# Patient Record
Sex: Male | Born: 1980
Health system: Southern US, Community
[De-identification: ages and names within clinical notes are randomized; demographics above are authoritative.]

## PROBLEM LIST (undated history)

## (undated) DIAGNOSIS — A749 Chlamydial infection, unspecified: Secondary | ICD-10-CM

## (undated) DIAGNOSIS — I10 Essential (primary) hypertension: Secondary | ICD-10-CM

---
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
## 2002-07-19 ENCOUNTER — Encounter: Payer: Self-pay | Admitting: Emergency Medicine

## 2002-07-19 ENCOUNTER — Emergency Department (HOSPITAL_COMMUNITY): Admission: EM | Admit: 2002-07-19 | Discharge: 2002-07-20 | Payer: Self-pay | Admitting: Emergency Medicine

## 2005-04-26 ENCOUNTER — Emergency Department (HOSPITAL_COMMUNITY): Admission: EM | Admit: 2005-04-26 | Discharge: 2005-04-27 | Payer: Self-pay | Admitting: Emergency Medicine

## 2005-06-05 ENCOUNTER — Emergency Department (HOSPITAL_COMMUNITY): Admission: EM | Admit: 2005-06-05 | Discharge: 2005-06-06 | Payer: Self-pay | Admitting: Emergency Medicine

## 2005-12-08 ENCOUNTER — Emergency Department (HOSPITAL_COMMUNITY): Admission: EM | Admit: 2005-12-08 | Discharge: 2005-12-08 | Payer: Self-pay | Admitting: Emergency Medicine

## 2005-12-16 ENCOUNTER — Encounter: Admission: RE | Admit: 2005-12-16 | Discharge: 2006-03-16 | Payer: Self-pay | Admitting: Orthopedic Surgery

## 2007-12-22 ENCOUNTER — Ambulatory Visit: Payer: Self-pay | Admitting: Family Medicine

## 2007-12-22 DIAGNOSIS — R030 Elevated blood-pressure reading, without diagnosis of hypertension: Secondary | ICD-10-CM | POA: Insufficient documentation

## 2007-12-28 ENCOUNTER — Telehealth (INDEPENDENT_AMBULATORY_CARE_PROVIDER_SITE_OTHER): Payer: Self-pay | Admitting: Family Medicine

## 2008-01-04 ENCOUNTER — Telehealth (INDEPENDENT_AMBULATORY_CARE_PROVIDER_SITE_OTHER): Payer: Self-pay | Admitting: Family Medicine

## 2008-01-20 ENCOUNTER — Telehealth (INDEPENDENT_AMBULATORY_CARE_PROVIDER_SITE_OTHER): Payer: Self-pay | Admitting: *Deleted

## 2008-01-24 ENCOUNTER — Ambulatory Visit: Payer: Self-pay | Admitting: Family Medicine

## 2008-01-24 ENCOUNTER — Encounter: Payer: Self-pay | Admitting: Family Medicine

## 2008-02-07 ENCOUNTER — Telehealth (INDEPENDENT_AMBULATORY_CARE_PROVIDER_SITE_OTHER): Payer: Self-pay | Admitting: Family Medicine

## 2008-02-07 LAB — CONVERTED CEMR LAB
LDL Cholesterol: 71 mg/dL (ref 0–99)
VLDL: 38 mg/dL (ref 0–40)

## 2008-02-14 ENCOUNTER — Telehealth (INDEPENDENT_AMBULATORY_CARE_PROVIDER_SITE_OTHER): Payer: Self-pay | Admitting: Family Medicine

## 2008-05-05 ENCOUNTER — Encounter (INDEPENDENT_AMBULATORY_CARE_PROVIDER_SITE_OTHER): Payer: Self-pay | Admitting: Family Medicine

## 2008-05-05 ENCOUNTER — Ambulatory Visit: Payer: Self-pay | Admitting: Family Medicine

## 2008-05-05 DIAGNOSIS — J029 Acute pharyngitis, unspecified: Secondary | ICD-10-CM | POA: Insufficient documentation

## 2008-07-20 ENCOUNTER — Telehealth: Payer: Self-pay | Admitting: *Deleted

## 2008-07-21 ENCOUNTER — Ambulatory Visit: Payer: Self-pay | Admitting: Family Medicine

## 2008-07-21 DIAGNOSIS — M654 Radial styloid tenosynovitis [de Quervain]: Secondary | ICD-10-CM | POA: Insufficient documentation

## 2008-10-13 ENCOUNTER — Ambulatory Visit: Payer: Self-pay | Admitting: Family Medicine

## 2008-10-13 ENCOUNTER — Encounter: Payer: Self-pay | Admitting: Family Medicine

## 2008-10-13 DIAGNOSIS — R109 Unspecified abdominal pain: Secondary | ICD-10-CM | POA: Insufficient documentation

## 2008-10-13 LAB — CONVERTED CEMR LAB
AST: 19 units/L (ref 0–37)
Albumin: 4.6 g/dL (ref 3.5–5.2)
Alkaline Phosphatase: 85 units/L (ref 39–117)
BUN: 8 mg/dL (ref 6–23)
Potassium: 3.9 meq/L (ref 3.5–5.3)
Sodium: 141 meq/L (ref 135–145)
Total Protein: 7.3 g/dL (ref 6.0–8.3)

## 2008-10-17 ENCOUNTER — Ambulatory Visit (HOSPITAL_COMMUNITY): Admission: RE | Admit: 2008-10-17 | Discharge: 2008-10-17 | Payer: Self-pay | Admitting: Family Medicine

## 2008-10-20 ENCOUNTER — Telehealth: Payer: Self-pay | Admitting: *Deleted

## 2008-11-10 ENCOUNTER — Encounter: Payer: Self-pay | Admitting: Family Medicine

## 2008-11-10 ENCOUNTER — Ambulatory Visit: Payer: Self-pay | Admitting: Family Medicine

## 2008-11-10 LAB — CONVERTED CEMR LAB: Chlamydia, Swab/Urine, PCR: NEGATIVE

## 2008-11-13 ENCOUNTER — Telehealth (INDEPENDENT_AMBULATORY_CARE_PROVIDER_SITE_OTHER): Payer: Self-pay | Admitting: *Deleted

## 2008-12-23 ENCOUNTER — Encounter (INDEPENDENT_AMBULATORY_CARE_PROVIDER_SITE_OTHER): Payer: Self-pay | Admitting: *Deleted

## 2008-12-23 DIAGNOSIS — F17200 Nicotine dependence, unspecified, uncomplicated: Secondary | ICD-10-CM | POA: Insufficient documentation

## 2009-02-01 ENCOUNTER — Ambulatory Visit: Payer: Self-pay | Admitting: Family Medicine

## 2009-02-01 ENCOUNTER — Encounter: Payer: Self-pay | Admitting: Family Medicine

## 2009-02-01 DIAGNOSIS — I10 Essential (primary) hypertension: Secondary | ICD-10-CM

## 2009-02-01 LAB — CONVERTED CEMR LAB
AST: 19 units/L (ref 0–37)
Albumin: 5.1 g/dL (ref 3.5–5.2)
Alkaline Phosphatase: 98 units/L (ref 39–117)
Bilirubin Urine: NEGATIVE
Ketones, urine, test strip: NEGATIVE
Lymphocytes Relative: 22 % (ref 12–46)
Lymphs Abs: 2.8 10*3/uL (ref 0.7–4.0)
Neutrophils Relative %: 68 % (ref 43–77)
Platelets: 251 10*3/uL (ref 150–400)
Potassium: 4.2 meq/L (ref 3.5–5.3)
Protein, U semiquant: NEGATIVE
Rapid Strep: NEGATIVE
Sodium: 141 meq/L (ref 135–145)
Total Protein: 7.8 g/dL (ref 6.0–8.3)
Urobilinogen, UA: 0.2
WBC: 12.7 10*3/uL — ABNORMAL HIGH (ref 4.0–10.5)

## 2009-02-16 ENCOUNTER — Telehealth: Payer: Self-pay | Admitting: Family Medicine

## 2009-03-27 ENCOUNTER — Ambulatory Visit: Payer: Self-pay | Admitting: Family Medicine

## 2009-08-20 ENCOUNTER — Emergency Department (HOSPITAL_COMMUNITY): Admission: EM | Admit: 2009-08-20 | Discharge: 2009-08-20 | Payer: Self-pay | Admitting: Emergency Medicine

## 2009-09-28 ENCOUNTER — Emergency Department (HOSPITAL_COMMUNITY): Admission: EM | Admit: 2009-09-28 | Discharge: 2009-09-28 | Payer: Self-pay | Admitting: Emergency Medicine

## 2010-04-16 NOTE — Assessment & Plan Note (Signed)
Summary: stomach issues,tcb   Vital Signs:  Patient profile:   30 year old male Weight:      167.5 pounds BMI:     23.45 Temp:     98.4 degrees F oral Pulse rate:   97 / minute Pulse rhythm:   regular BP sitting:   159 / 91  (left arm) Cuff size:   regular  Vitals Entered By: Loralee Pacas CMA (March 27, 2009 11:32 AM) CC: stomach issues Pain Assessment Patient in pain? no      Comments stomach problems have been going on for 2 years. pt stated that he's been back and forth to the dr's concerning this and spoke with deborah hill and she suggested that he get a referral to a GI.  he's tried everything that the dr's have perscribe to no avail.   Primary Care Provider:  Doree Albee MD  CC:  stomach issues.  History of Present Illness: 2-3 years history of abdominal pain in RUQ.  Has been seen multiple times in this office for the same problem. Pain is sharp/crampy, episodes were every several days at start, now daily.  Sometiems better with water or meals, but now nothing seems to help.  Can occur at any time of day but does not wake him up from sleep.    Has tried Prilosec, Gas-ex, OTC acid reducers without any relief.  History of negative H. Pylori 10/2008.  Ultrasound 10/2008 normal.  Denies reflux, nighttime cough, diarrhea, constipation. Has had 15-20 pound weight loss in the past year, but has cut out fast food.  Feels pressure in RUQ, but no bloating.  Nauseous several tiems weekly, rarely emesis.  Has daily bowel movements, no blood.  HYPERTENSION Meds: Taking and tolerating? NO, never started because did not feel like taking meds but feel she is ready now BP remains elevated. Home BP's: no    Current Medications (verified): 1)  Hydrochlorothiazide 25 Mg Tabs (Hydrochlorothiazide) .... Take 1/2 Tablet Daily  Allergies: No Known Drug Allergies PMH-FH-SH reviewed-no changes except otherwise noted  Family History: mother (32)- healthy father (53)- HTN, Insulin  dependant Diabetes  sister- healthy aunt and uncle are [place]ilene and Building surveyor  Colon Ca: two uncles age 1's-60's (father's side).  one of the uncle's daughter with colon cancer (age 51's-40's)  Social History: Smokes 1ppd since 1996.  Lives with [place].  Used to Engineer, manufacturing systems, now Mining engineer for living.  Education through 10th grade.  No alcohol use. No illicit drugs.  Has 2 sons who visit on the weekends. Never married.  Does not exercise.  Formerly incarcerated (unknown reason).  Review of Systems      See HPI General:  Complains of weight loss; denies fatigue, fever, loss of appetite, sleep disorder, sweats, and weakness. Eyes:  Denies blurring. ENT:  Denies sore throat. CV:  Denies chest pain or discomfort, fatigue, lightheadness, near fainting, palpitations, shortness of breath with exertion, and swelling of feet. Resp:  Denies chest discomfort and shortness of breath. GI:  Complains of abdominal pain; denies bloody stools.  Physical Exam  General:  alert and well-developed.  normal body habitus.  Multiple tattooos.  Vital signs reviewed. BP.  Rechecked 152/100 Neck:  supple and full ROM.  no lymphadenopathy. Lungs:  Normal respiratory effort, chest expands symmetrically. Lungs are clear to auscultation, no crackles or wheezes. Heart:  Normal rate and regular rhythm. S1 and S2 normal without gallop, murmur, click, rub or other extra sounds. Abdomen:  + mild tenderness to palpation  in RUQ, non distended, + bowel sounds x 4.  No rebound or guarding. Extremities:  no LE edema   Impression & Recommendations:  Problem # 1:  ABDOMINAL PAIN (ICD-789.00) Assessment Deteriorated Chronic abdominal pain with no clear etiology.  ? IBS but not classic presentation.  Given history of familial colon cancer and unclear symptoms, will refer to GI.  Orders: Gastroenterology Referral (GI) [facility]- Est  Level 4 (73710)  Problem # 2:  HYPERTENSION, BENIGN ESSENTIAL (ICD-401.1) Assessment:  Deteriorated  Patient has not started taking HCTZ.  Agrees to begin.  WIll start with 1/2 tablet daily and patient will check his BP, if astill elevated, will take one full tablet daily.  Asked pt to return to see his PCP in 1 month.  His updated medication list for this problem includes:    Hydrochlorothiazide 25 Mg Tabs (Hydrochlorothiazide) .[facility]... Take 1/2 tablet daily  Orders: [facility]- Est  Level 4 (62694)  Complete Medication List: 1)  Hydrochlorothiazide 25 Mg Tabs (Hydrochlorothiazide) .... Take 1/2 tablet daily  Patient Instructions: 1)  I sent in your blood pressure medication to [facility] on [address].  Check your blood pressure weekly: normal is less than 140/90.  If it remains elevated despite beign on medication, switch to taking a full tablet. 2)  Make follow-up appt with Dr. Alvester Morin in 1 month. 3)  Will start paperwork for GI referral.  WIll call you apopintment when available. Prescriptions: HYDROCHLOROTHIAZIDE 25 MG TABS (HYDROCHLOROTHIAZIDE) take 1/2 tablet daily  #30 x 3   Entered by:   Loralee Pacas CMA   Authorized by:   Delbert Harness MD   Signed by:   Loralee Pacas CMA on 03/27/2009   Method used:   Electronically to        [facility]       [address]       [place], [place]  [address]       Ph: [phone]       Fax: [phone]   RxID:   [id number]

## 2010-06-03 LAB — URINALYSIS, ROUTINE W REFLEX MICROSCOPIC
Glucose, UA: NEGATIVE mg/dL
Hgb urine dipstick: NEGATIVE
Ketones, ur: NEGATIVE mg/dL
Protein, ur: NEGATIVE mg/dL

## 2011-01-25 ENCOUNTER — Emergency Department (INDEPENDENT_AMBULATORY_CARE_PROVIDER_SITE_OTHER)
Admission: EM | Admit: 2011-01-25 | Discharge: 2011-01-25 | Disposition: A | Discharge status: Home / Self Care | Payer: Self-pay | Source: Home / Self Care | Attending: Emergency Medicine | Admitting: Emergency Medicine

## 2011-01-25 ENCOUNTER — Encounter: Payer: Self-pay | Admitting: *Deleted

## 2011-01-25 DIAGNOSIS — M65839 Other synovitis and tenosynovitis, unspecified forearm: Secondary | ICD-10-CM

## 2011-01-25 HISTORY — DX: Essential (primary) hypertension: I10

## 2011-01-25 MED ORDER — MELOXICAM 7.5 MG PO TABS: 7.5000 mg | ORAL_TABLET | Freq: Every day | ORAL | Status: AC

## 2011-01-25 NOTE — ED Provider Notes (Signed)
History     CSN: [id number] Arrival date & time: 01/25/2011  4:59 PM   First MD Initiated Contact with Patient 01/25/11 1545      Chief Complaint  Patient presents with  . Extremity Pain    pain right hand onset x one month no relief  same type pain couple months ago lasted approx 2 weeks - pain increases with movement   no known injury    (Consider location/radiation/quality/duration/timing/severity/associated sxs/prior treatment) HPI  Past Medical History  Diagnosis Date  . Hypertension     History reviewed. No pertinent past surgical history.  History reviewed. No pertinent family history.  History  Substance Use Topics  . Smoking status: Current Everyday Smoker  . Smokeless tobacco: Not on file  . Alcohol Use: No      Review of Systems  Allergies  Review of patient's allergies indicates no known allergies.  Home Medications   Current Outpatient Rx  Name Route Sig Dispense Refill  . HYDROCHLOROTHIAZIDE 25 MG PO TABS Oral Take 12.5 mg by mouth daily.        BP 144/93  Pulse 77  Temp(Src) 98.2 F (36.8 C) (Oral)  Resp 18  SpO2 97%  Physical Exam  ED Course  Procedures (including critical care time)  Labs Reviewed - No data to display No results found.   No diagnosis found.    MDM  R hand pain x 2 weeks- denies injury or trauma or falls - occupation painter-        Jimmie Molly, MD 01/25/11 [phone]

## 2011-01-25 NOTE — ED Notes (Signed)
Went out to get pt called pt he was not in waiting area  [place] outside and called pt he was not there either.

## 2011-08-11 ENCOUNTER — Emergency Department (INDEPENDENT_AMBULATORY_CARE_PROVIDER_SITE_OTHER)
Admission: EM | Admit: 2011-08-11 | Discharge: 2011-08-11 | Disposition: A | Discharge status: Home Health | Payer: Self-pay | Source: Home / Self Care | Attending: Emergency Medicine | Admitting: Emergency Medicine

## 2011-08-11 ENCOUNTER — Encounter (HOSPITAL_COMMUNITY): Payer: Self-pay

## 2011-08-11 DIAGNOSIS — N453 Epididymo-orchitis: Secondary | ICD-10-CM

## 2011-08-11 DIAGNOSIS — N451 Epididymitis: Secondary | ICD-10-CM

## 2011-08-11 HISTORY — DX: Chlamydial infection, unspecified: A74.9

## 2011-08-11 LAB — POCT URINALYSIS DIP (DEVICE)
Glucose, UA: NEGATIVE mg/dL
Hgb urine dipstick: NEGATIVE
Nitrite: NEGATIVE
Urobilinogen, UA: 0.2 mg/dL (ref 0.0–1.0)

## 2011-08-11 MED ORDER — CIPROFLOXACIN HCL 500 MG PO TABS: 500.0000 mg | ORAL_TABLET | Freq: Two times a day (BID) | ORAL | Status: AC

## 2011-08-11 MED ORDER — LIDOCAINE HCL (PF) 1 % IJ SOLN
INTRAMUSCULAR | Status: AC
  Filled 2011-08-11: qty 5

## 2011-08-11 MED ORDER — AZITHROMYCIN 250 MG PO TABS
ORAL_TABLET | ORAL | Status: AC
  Filled 2011-08-11: qty 4

## 2011-08-11 MED ORDER — CEFTRIAXONE SODIUM 250 MG IJ SOLR
INTRAMUSCULAR | Status: AC
  Filled 2011-08-11: qty 250

## 2011-08-11 MED ORDER — CEFTRIAXONE SODIUM 250 MG IJ SOLR
250.0000 mg | Freq: Once | INTRAMUSCULAR | Status: AC
  Administered 2011-08-11: 250 mg via INTRAMUSCULAR

## 2011-08-11 MED ORDER — AZITHROMYCIN 250 MG PO TABS
1000.0000 mg | ORAL_TABLET | Freq: Once | ORAL | Status: AC
  Administered 2011-08-11: 1000 mg via ORAL

## 2011-08-11 NOTE — ED Notes (Signed)
Pt c/o testicular pain onset 3-4 weeks ago.  Pt denies urinary SX, denies penile discharge.  Pt denies injury.

## 2011-08-11 NOTE — Discharge Instructions (Signed)
To give [place] a working phone number so that we can contact you if needed. If your labs come back positive, you will need to have your partner treated as well. Refrain from intercourse until you feel better. I would suggest following up the primary care physician in about 5 days, to get your results, and be reevaluated to see if you're doing better, and if necessary, to get appropriate imaging. Return to the ER for fever above 100.4, if you pain changes or gets worse, if you start vomiting, or any other concerns.

## 2011-08-11 NOTE — ED Provider Notes (Signed)
History     CSN: [id number]  Arrival date & time 08/11/11  1512   First MD Initiated Contact with Patient 08/11/11 1655      Chief Complaint  Patient presents with  . Testicle Pain    (Consider location/radiation/quality/duration/timing/severity/associated sxs/prior treatment) HPI Comments: Patient reports achy, constant, nonradiating, testicular pain at the base of his scrotum starting about a month ago. It gets worse when he stands, and slightly better when he cups his testicles for support. It is not affected with Valsalva, or heavy lifting. He has not noticed any bulging in his groin, testicular swelling, color change. States his testicles feel normal to him. He has been doing regular testicular self exams.  No nausea, vomiting, hematuria, urinary urgency, frequency, penile discharge, penile rash. No abdominal, flank, or back pain. He denies any recent or remote history of trauma to his testicles. No unintentional weight loss. He is sexually active with 2 male partners, both of which are asymptomatic. He states he's not had intercourse since he started having these symptoms. He intermittently uses condoms with them. He has a history of Chlamydia. No history of gonorrhea, herpes, HIV, syphilis, prostatitis. No history of UTIs, kidney stones.  ROS as noted in HPI. All other ROS negative.   Patient is a 31 y.o. male presenting with testicular pain.  Testicle Pain    Past Medical History  Diagnosis Date  . Hypertension   . Chlamydia     History reviewed. No pertinent past surgical history.  History reviewed. No pertinent family history.  History  Substance Use Topics  . Smoking status: Current Everyday Smoker  . Smokeless tobacco: Not on file  . Alcohol Use: Yes      Review of Systems  Genitourinary: Positive for testicular pain.    Allergies  Review of patient's allergies indicates no known allergies.  Home Medications   Current Outpatient Rx  Name Route Sig  Dispense Refill  . CIPROFLOXACIN HCL 500 MG PO TABS Oral Take 1 tablet (500 mg total) by mouth 2 (two) times daily. X 10 days 20 tablet 0  . HYDROCHLOROTHIAZIDE 25 MG PO TABS Oral Take 12.5 mg by mouth daily.      . MELOXICAM 7.5 MG PO TABS Oral Take 1 tablet (7.5 mg total) by mouth daily. 14 tablet 0    BP 121/71  Pulse 82  Temp(Src) 98.2 F (36.8 C) (Oral)  Resp 16  SpO2 96%  Physical Exam  Nursing note and vitals reviewed. Constitutional: He is oriented to person, place, and time. He appears well-developed and well-nourished.  HENT:  Head: Normocephalic and atraumatic.  Eyes: Conjunctivae and EOM are normal.  Neck: Normal range of motion.  Cardiovascular: Normal rate.   Pulmonary/Chest: Effort normal. No respiratory distress.  Abdominal: Normal appearance and bowel sounds are normal. He exhibits no distension. There is no tenderness. There is no rebound, no guarding and no CVA tenderness. Hernia confirmed negative in the right inguinal area and confirmed negative in the left inguinal area.  Genitourinary: Rectum normal, prostate normal, testes normal and penis normal. Prostate is not tender. Cremasteric reflex is present. Right testis shows no mass, no swelling and no tenderness. Cremasteric reflex is not absent on the right side. Left testis shows no mass, no swelling and no tenderness. Cremasteric reflex is not absent on the left side. Circumcised.       No testicular tenderness. Mild tenderness at the uppermost part of the scrotum. No genital rash, penile discharge. Patient declined chaperone  Musculoskeletal: Normal range of motion.  Lymphadenopathy:       Right: No inguinal adenopathy present.  Neurological: He is alert and oriented to person, place, and time.  Skin: Skin is warm and dry.  Psychiatric: He has a normal mood and affect. His behavior is normal.    ED Course  Procedures (including critical care time)  Labs Reviewed  POCT URINALYSIS DIP (DEVICE) - Abnormal;  Notable for the following:    Ketones, ur TRACE (*)    All other components within normal limits  GC/CHLAMYDIA PROBE AMP, GENITAL  URINE CULTURE   No results found.   1. Epididymitis       MDM  H&P most consistent with epididymitis. Doubt torsion, as this is been constant, and going on for a month. Will treat empirically for STI causing his symptoms. Giving Rocephin term 50 mg and azithromycin 1 g by mouth here sending GC probe off. Urine is normal today. Sending this off for culture as well. Will send him home with Cipro 500 mg twice a day x10 days. Advised patient to refrain from sexual intercourse until he knows results. Advised him to followup with his primary care physician in about 5 days for all the results, reevaluation, imaging if needed, and referral to urology if needed. Advised ice, NSAIDs, testicular support. Discussed labs, MDM with patient. Patient agrees with plan.     Luiz Blare, MD 08/11/11 [phone]

## 2011-08-12 LAB — GC/CHLAMYDIA PROBE AMP, GENITAL
Chlamydia, DNA Probe: NEGATIVE
GC Probe Amp, Genital: NEGATIVE

## 2011-08-12 LAB — URINE CULTURE

## 2011-08-18 ENCOUNTER — Telehealth (HOSPITAL_COMMUNITY): Payer: Self-pay | Admitting: *Deleted

## 2011-08-18 NOTE — ED Notes (Signed)
Pt. called for his lab results. I told him I would call back, because I was with a pt. I called back. Pt. verified x 2 and given results (GC/Chlamydia neg.). Vassie Moselle 08/18/2011

## 2016-09-11 ENCOUNTER — Emergency Department (HOSPITAL_COMMUNITY): Payer: Self-pay

## 2016-09-11 ENCOUNTER — Encounter (HOSPITAL_COMMUNITY): Payer: Self-pay | Admitting: Emergency Medicine

## 2016-09-11 ENCOUNTER — Emergency Department (HOSPITAL_COMMUNITY)
Admission: EM | Admit: 2016-09-11 | Discharge: 2016-09-11 | Disposition: A | Discharge status: Home / Self Care | Payer: Self-pay | Attending: Emergency Medicine | Admitting: Emergency Medicine

## 2016-09-11 DIAGNOSIS — I10 Essential (primary) hypertension: Secondary | ICD-10-CM | POA: Insufficient documentation

## 2016-09-11 DIAGNOSIS — N50811 Right testicular pain: Secondary | ICD-10-CM | POA: Insufficient documentation

## 2016-09-11 DIAGNOSIS — N50819 Testicular pain, unspecified: Secondary | ICD-10-CM

## 2016-09-11 DIAGNOSIS — F17200 Nicotine dependence, unspecified, uncomplicated: Secondary | ICD-10-CM | POA: Insufficient documentation

## 2016-09-11 DIAGNOSIS — R1031 Right lower quadrant pain: Secondary | ICD-10-CM | POA: Insufficient documentation

## 2016-09-11 LAB — BASIC METABOLIC PANEL
Anion gap: 9 (ref 5–15)
BUN: 9 mg/dL (ref 6–20)
CALCIUM: 9.4 mg/dL (ref 8.9–10.3)
CO2: 26 mmol/L (ref 22–32)
Chloride: 103 mmol/L (ref 101–111)
Creatinine, Ser: 1.02 mg/dL (ref 0.61–1.24)
GFR calc Af Amer: >60 mL/min (ref >60)
GFR calc non Af Amer: >60 mL/min (ref >60)
GLUCOSE: 141 mg/dL — AB (ref 65–99)
Potassium: 4 mmol/L (ref 3.5–5.1)
SODIUM: 138 mmol/L (ref 135–145)

## 2016-09-11 LAB — URINALYSIS, ROUTINE W REFLEX MICROSCOPIC
BACTERIA UA: NONE SEEN
BILIRUBIN URINE: NEGATIVE
Glucose, UA: NEGATIVE mg/dL
HGB URINE DIPSTICK: NEGATIVE
KETONES UR: NEGATIVE mg/dL
LEUKOCYTES UA: NEGATIVE
NITRITE: NEGATIVE
PH: 5 (ref 5.0–8.0)
Protein, ur: NEGATIVE mg/dL
Specific Gravity, Urine: 1.025 (ref 1.005–1.030)

## 2016-09-11 LAB — CBC
HCT: 46.5 % (ref 39.0–52.0)
Hemoglobin: 16.5 g/dL (ref 13.0–17.0)
MCH: 30 pg (ref 26.0–34.0)
MCHC: 35.5 g/dL (ref 30.0–36.0)
MCV: 84.5 fL (ref 78.0–100.0)
PLATELETS: 272 10*3/uL (ref 150–400)
RBC: 5.5 MIL/uL (ref 4.22–5.81)
RDW: 13 % (ref 11.5–15.5)
WBC: 11.9 10*3/uL — AB (ref 4.0–10.5)

## 2016-09-11 MED ORDER — ONDANSETRON HCL 4 MG/2ML IJ SOLN
4.0000 mg | Freq: Once | INTRAMUSCULAR | Status: AC
  Administered 2016-09-11: 4 mg via INTRAVENOUS
  Filled 2016-09-11: qty 2

## 2016-09-11 MED ORDER — HYDROCODONE-ACETAMINOPHEN 5-325 MG PO TABS: 1.0000 | ORAL_TABLET | ORAL | 0 refills | Status: DC | PRN

## 2016-09-11 MED ORDER — HYDROMORPHONE HCL 1 MG/ML IJ SOLN
1.0000 mg | Freq: Once | INTRAMUSCULAR | Status: AC
  Administered 2016-09-11: 1 mg via INTRAVENOUS
  Filled 2016-09-11: qty 1

## 2016-09-11 MED ORDER — ONDANSETRON 8 MG PO TBDP: 8.0000 mg | ORAL_TABLET | Freq: Three times a day (TID) | ORAL | 0 refills | Status: DC | PRN

## 2016-09-11 MED ORDER — KETOROLAC TROMETHAMINE 30 MG/ML IJ SOLN
30.0000 mg | Freq: Once | INTRAMUSCULAR | Status: AC
  Administered 2016-09-11: 30 mg via INTRAVENOUS
  Filled 2016-09-11: qty 1

## 2016-09-11 NOTE — ED Provider Notes (Signed)
[facility]-EMERGENCY DEPT Provider Note   CSN: [id number] Arrival date & time: 09/11/16  0012  By signing my name below, I, Rosana Fretana Waskiewicz, attest that this documentation has been prepared under the direction and in the presence of Azalia Bilisampos, Gregorio Worley, MD. Electronically Signed: Rosana Fretana Waskiewicz, ED Scribe. 09/11/16. 2:19 AM.  History   Chief Complaint Chief Complaint  Patient presents with  . Testicle Pain   The history is provided by the patient. No language interpreter was used.   HPI Comments: Matthew Stokes is a 36 y.o. male who presents to the Emergency Department via EMS complaining of sudden onset, right-sided abdominal pain onset 3 hours ago. Pt states he was getting showering when he step out and felt pain shooting down to his right testicle. Pt reports associated vomiting and nausea. No treatments tried prior to arrival. Pt denies fever or any other complaints at this time.  Past Medical History:  Diagnosis Date  . Chlamydia   . Hypertension     Patient Active Problem List   Diagnosis Date Noted  . HYPERTENSION, BENIGN ESSENTIAL 02/01/2009  . TOBACCO USER 12/23/2008  . ABDOMINAL PAIN 10/13/2008  . DE QUERVAIN'S TENOSYNOVITIS 07/21/2008  . SORE THROAT 05/05/2008  . PREHYPERTENSION 12/22/2007    History reviewed. No pertinent surgical history.     Home Medications    Prior to Admission medications   Medication Sig Start Date End Date Taking? Authorizing Provider  ibuprofen (ADVIL,MOTRIN) 200 MG tablet Take 800 mg by mouth every 6 (six) hours as needed for moderate pain.   Yes [provider]    Family History History reviewed. No pertinent family history.  Social History Social History  Substance Use Topics  . Smoking status: Current Every Day Smoker  . Smokeless tobacco: Never Used  . Alcohol use Yes     Allergies   Patient has no known allergies.   Review of Systems Review of Systems All other systems reviewed and are negative for acute  change except as noted in the HPI.  Physical Exam Updated Vital Signs BP (!) 157/94   Pulse 89   Temp 97.9 F (36.6 C) (Oral)   Resp 18   SpO2 90%   Physical Exam  Constitutional: He is oriented to person, place, and time. He appears well-developed and well-nourished.  HENT:  Head: Normocephalic and atraumatic.  Eyes: EOM are normal.  Neck: Normal range of motion.  Cardiovascular: Normal rate, regular rhythm, normal heart sounds and intact distal pulses.   Pulmonary/Chest: Effort normal and breath sounds normal. No respiratory distress.  Abdominal: Soft. He exhibits no distension. There is no tenderness.  Genitourinary:  Genitourinary Comments: No testicular tenderness. No palpable hernia on the right.   Musculoskeletal: Normal range of motion.  Neurological: He is alert and oriented to person, place, and time.  Skin: Skin is warm and dry.  Psychiatric: He has a normal mood and affect. Judgment normal.  Nursing note and vitals reviewed.    ED Treatments / Results  DIAGNOSTIC STUDIES: Oxygen Saturation is 96% on RA, normal by my interpretation.   COORDINATION OF CARE: 2:14 AM-Discussed next steps with pt including pain management and a CT scan. Pt verbalized understanding and is agreeable with the plan.   Labs (all labs ordered are listed, but only abnormal results are displayed) Labs Reviewed  URINALYSIS, ROUTINE W REFLEX MICROSCOPIC - Abnormal; Notable for the following:       Result Value   Squamous Epithelial / LPF 0-5 (*)    All  other components within normal limits  CBC - Abnormal; Notable for the following:    WBC 11.9 (*)    All other components within normal limits  BASIC METABOLIC PANEL - Abnormal; Notable for the following:    Glucose, Bld 141 (*)    All other components within normal limits    EKG  EKG Interpretation None       Radiology US Scrotum  Result Date: 09/11/2016 CLINICAL DATA:  Pain in the abdominal right lower quadrant radiating  to the right hemiscrotum. EXAM: SCROTAL ULTRASOUND DOPPLER ULTRASOUND OF THE TESTICLES TECHNIQUE: Complete ultrasound examination of the testicles, epididymis, and other scrotal structures was performed. Color and spectral Doppler ultrasound were also utilized to evaluate blood flow to the testicles. COMPARISON:  None. FINDINGS: Right testicle Measurements: 4.9 x 3.2 x 3.4 cm. No mass or microlithiasis visualized. Left testicle Measurements: 5.1 x 2.7 x 3.5 Cm. No mass or microlithiasis visualized. Right epididymis:  3 mm epididymal head cyst Left epididymis:  Normal in size and appearance. Hydrocele:  Small bilateral hydroceles Varicocele:  None visualized. Pulsed Doppler interrogation of both testes demonstrates normal low resistance arterial and venous waveforms bilaterally. IMPRESSION: No testicular mass or torsion.  Small bilateral hydroceles. Electronically Signed   By: Ellery Plunk M.D.   On: 09/11/2016 05:23   [place] Art/ven Flow Abd Pelv Doppler  Result Date: 09/11/2016 CLINICAL DATA:  Pain in the abdominal right lower quadrant radiating to the right hemiscrotum. EXAM: SCROTAL ULTRASOUND DOPPLER ULTRASOUND OF THE TESTICLES TECHNIQUE: Complete ultrasound examination of the testicles, epididymis, and other scrotal structures was performed. Color and spectral Doppler ultrasound were also utilized to evaluate blood flow to the testicles. COMPARISON:  None. FINDINGS: Right testicle Measurements: 4.9 x 3.2 x 3.4 cm. No mass or microlithiasis visualized. Left testicle Measurements: 5.1 x 2.7 x 3.5 Cm. No mass or microlithiasis visualized. Right epididymis:  3 mm epididymal head cyst Left epididymis:  Normal in size and appearance. Hydrocele:  Small bilateral hydroceles Varicocele:  None visualized. Pulsed Doppler interrogation of both testes demonstrates normal low resistance arterial and venous waveforms bilaterally. IMPRESSION: No testicular mass or torsion.  Small bilateral hydroceles. Electronically  Signed   By: Ellery Plunk M.D.   On: 09/11/2016 05:23   Ct Renal Stone Study  Result Date: 09/11/2016 CLINICAL DATA:  Abdominal pain radiating to the groin. EXAM: CT ABDOMEN AND PELVIS WITHOUT CONTRAST TECHNIQUE: Multidetector CT imaging of the abdomen and pelvis was performed following the standard protocol without IV contrast. COMPARISON:  None. FINDINGS: Lower chest: No acute abnormality. Hepatobiliary: Diffuse fatty infiltration of the liver without focal lesion. Gallbladder and bile ducts are unremarkable. Pancreas: Unremarkable. No pancreatic ductal dilatation or surrounding inflammatory changes. Spleen: Normal in size without focal abnormality. Adrenals/Urinary Tract: Both adrenals are normal. No suspicious renal parenchymal lesions. No hydronephrosis. Multiple 2-3 mm calculi in both collecting systems. No ureteral calculi. Unremarkable urinary bladder Stomach/Bowel: Small hiatal hernia. Stomach is within normal limits. Appendix is normal. No evidence of bowel wall thickening, distention, or inflammatory changes. Vascular/Lymphatic: The abdominal aorta is normal in caliber with mild atherosclerotic calcification. No adenopathy in the abdomen or pelvis. Reproductive: Unremarkable Other: No focal inflammation. No ascites. Small fat containing umbilical hernia. Musculoskeletal: No significant skeletal lesion. IMPRESSION: 1. Bilateral nephrolithiasis.  No ureteral calculi. 2. Hepatic steatosis. 3. Small fat containing umbilical hernia. 4. Aortic atherosclerosis. Electronically Signed   By: Ellery Plunk M.D.   On: 09/11/2016 04:00    Procedures Procedures (including critical care time)  Medications Ordered in ED Medications  HYDROmorphone (DILAUDID) injection 1 mg (1 mg Intravenous Given 09/11/16 0249)  ondansetron (ZOFRAN) injection 4 mg (4 mg Intravenous Given 09/11/16 0249)  ketorolac (TORADOL) 30 MG/ML injection 30 mg (30 mg Intravenous Given 09/11/16 0248)     Initial Impression /  Assessment and Plan / ED Course  I have reviewed the triage vital signs and the nursing notes.  Pertinent labs & imaging results that were available during my care of the patient were reviewed by me and considered in my medical decision making (see chart for details).     6:08 AM Feels much better this time.  CT scan without acute ureteral stone.  May represent recently passed ureteral stone.  Ultrasound testicle shows good flow and no signs of epididymitis.  No hernia on examination.  No testicular tenderness.  Feels much better.  Primary care and urology follow-up.  Understands to return to the ER for new or worsening symptoms  Final Clinical Impressions(s) / ED Diagnoses   Final diagnoses:  Acute right lower quadrant pain  Testicular pain    New Prescriptions New Prescriptions   HYDROCODONE-ACETAMINOPHEN (NORCO/VICODIN) 5-325 MG TABLET    Take 1 tablet by mouth every 4 (four) hours as needed for moderate pain.   ONDANSETRON (ZOFRAN ODT) 8 MG DISINTEGRATING TABLET    Take 1 tablet (8 mg total) by mouth every 8 (eight) hours as needed for nausea or vomiting.   I personally performed the services described in this documentation, which was scribed in my presence. The recorded information has been reviewed and is accurate.       Azalia Bilis, MD 09/11/16 [phone]

## 2016-09-11 NOTE — ED Triage Notes (Signed)
Pt while showering lifted right leg causing pain in right testes radiating up into abdomin

## 2016-09-11 NOTE — ED Notes (Signed)
Pt is c/o of RLQ  9/10 that began yesterday pt described  as crushing. Pt has associated N/V and denies diarrhea, pain or burning with urination and any blood noted in his stool.

## 2018-02-08 IMAGING — CT CT RENAL STONE PROTOCOL
2 of 3 series · 16 of 46 positions shown, 18 images · non-contrast
Comparison: None.

CLINICAL DATA: Abdominal pain radiating to the groin.

EXAM:
CT ABDOMEN AND PELVIS WITHOUT CONTRAST
TECHNIQUE: Multidetector CT imaging of the abdomen and pelvis was performed
following the standard protocol without IV contrast.

[Series 3: lung · axial · 0.70mm/px · z∈[+1508,+1596]mm · 13 of 52 slices shown, 15 images]
[im 4/52  soft-tissue]
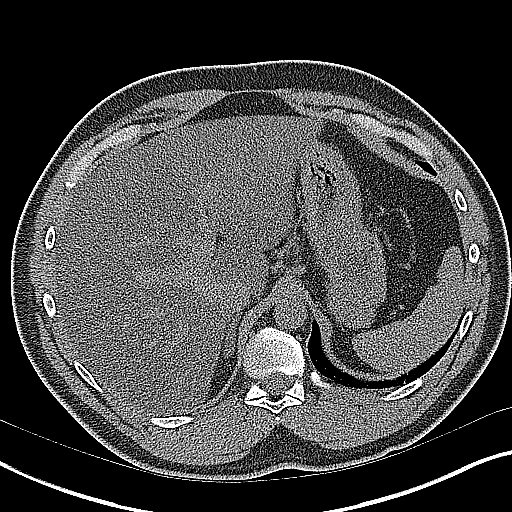
[im 4/52  bone]
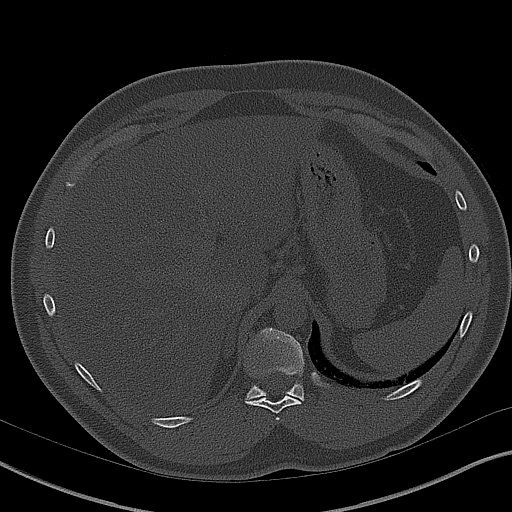
[im 7/52  soft-tissue]
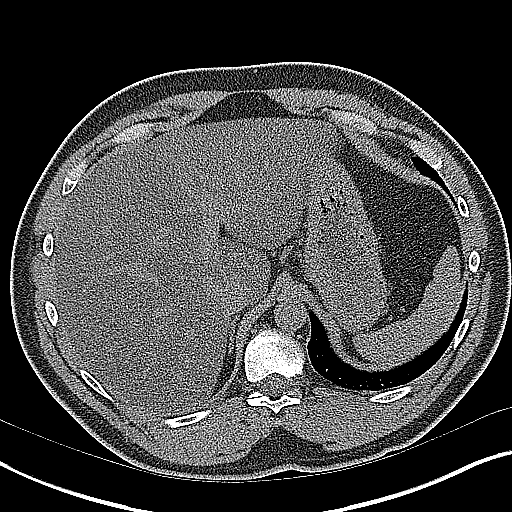
[im 10/52  soft-tissue]
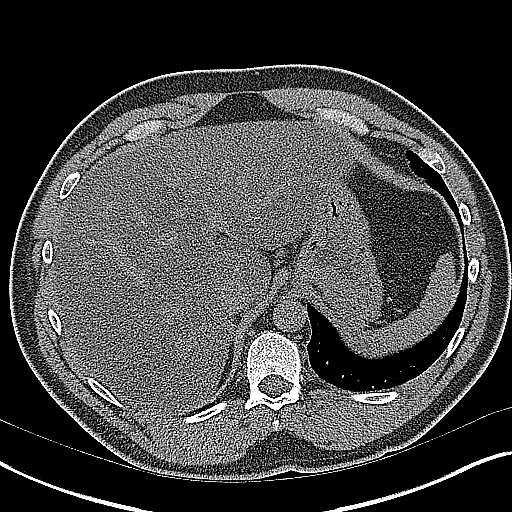
[im 15/52  soft-tissue]
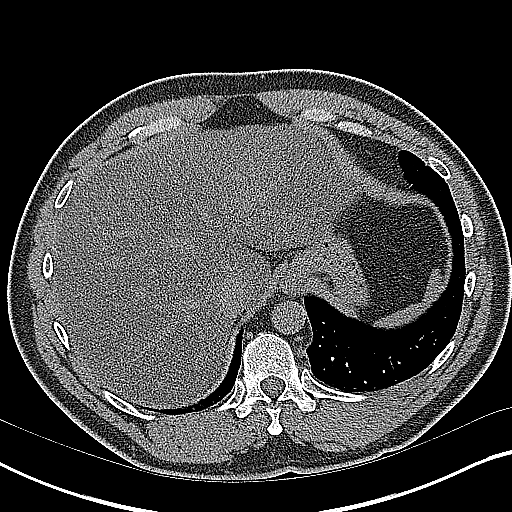
[im 19/52  soft-tissue]
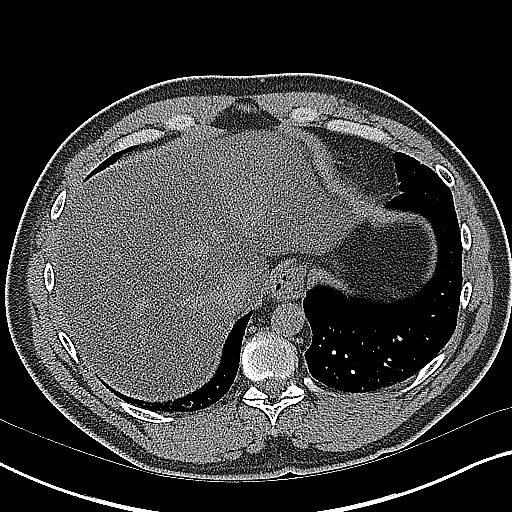
[im 22/52  soft-tissue]
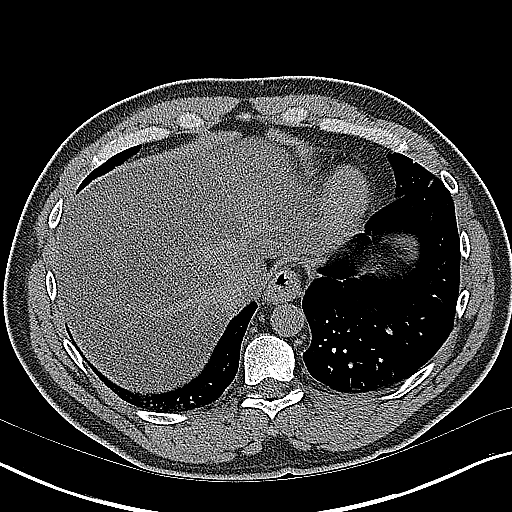
[im 27/52  soft-tissue]
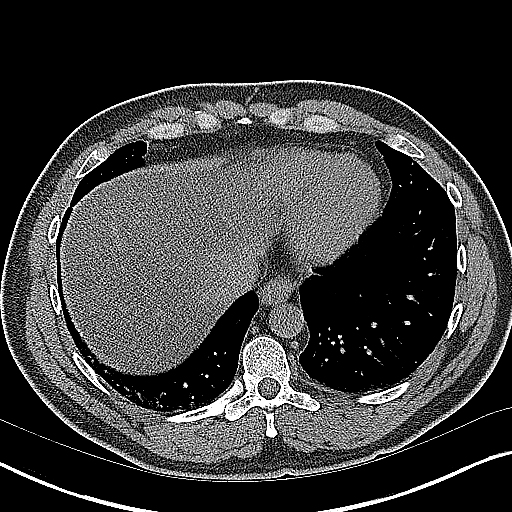
[im 30/52  soft-tissue]
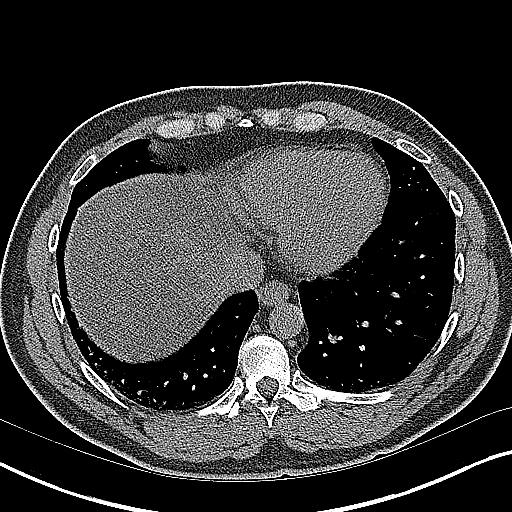
[im 33/52  soft-tissue]
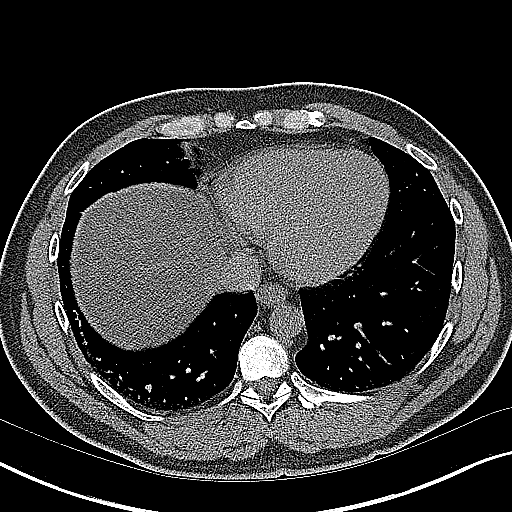
[im 33/52  bone]
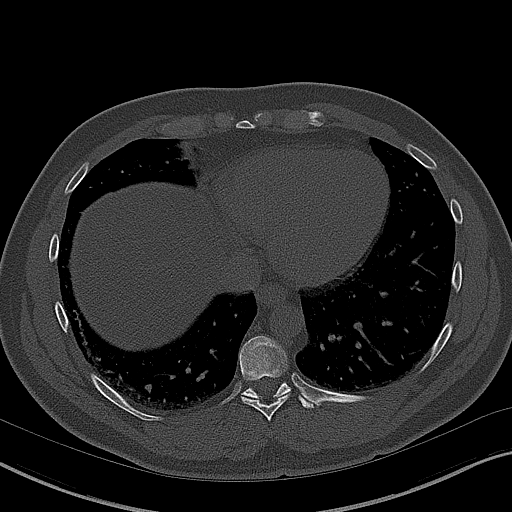
[im 37/52  soft-tissue]
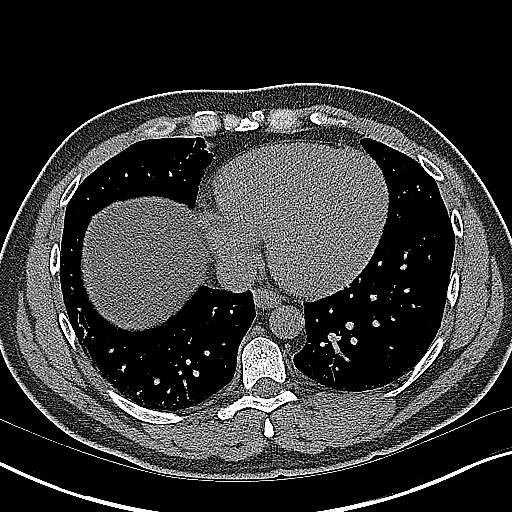
[im 42/52  soft-tissue]
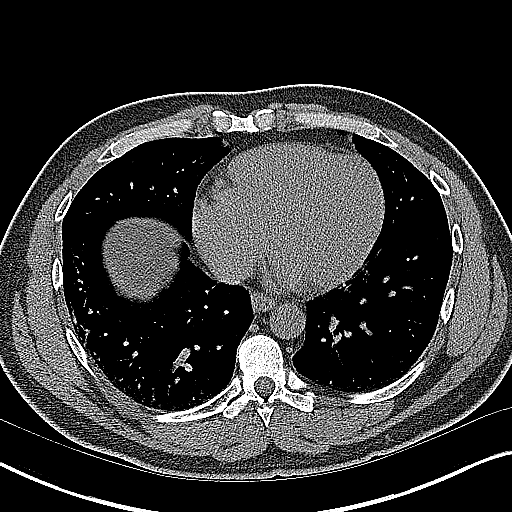
[im 45/52  soft-tissue]
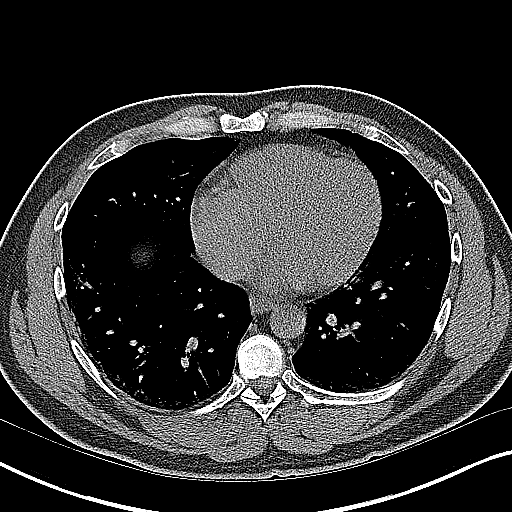
[im 48/52  soft-tissue]
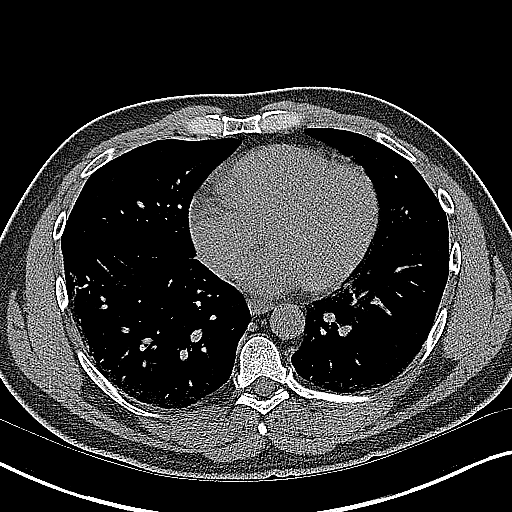

[Series 4: coronal · coronal · 0.66mm/px · 3 of 134 slices shown]
[im 45/134  soft-tissue]
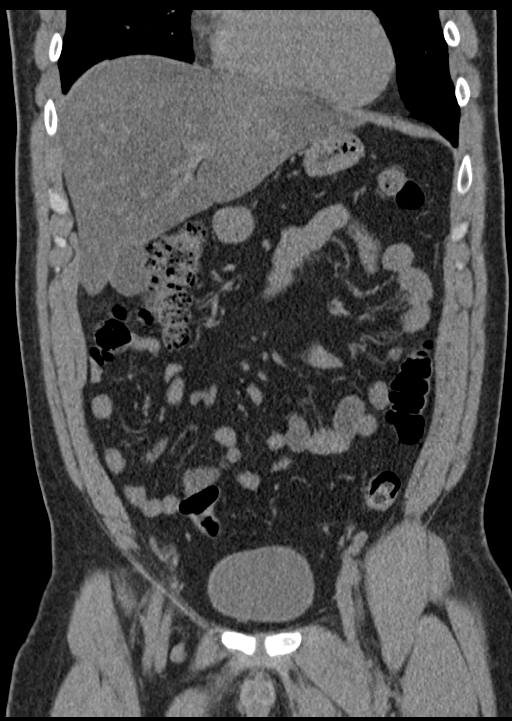
[im 60/134  soft-tissue]
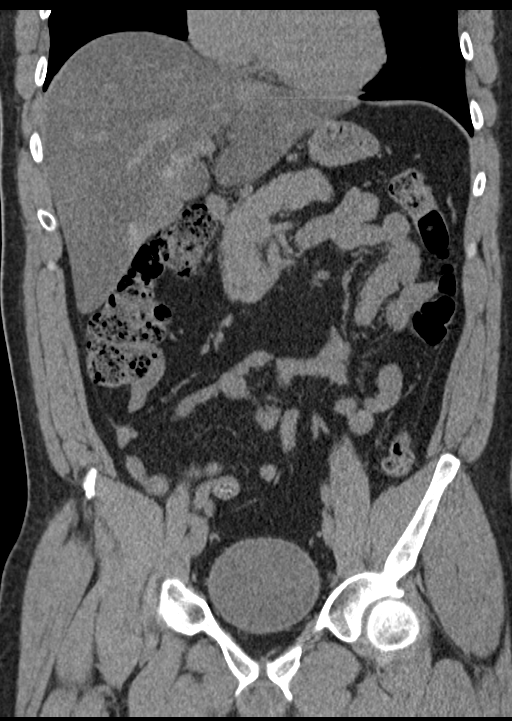
[im 74/134  soft-tissue]
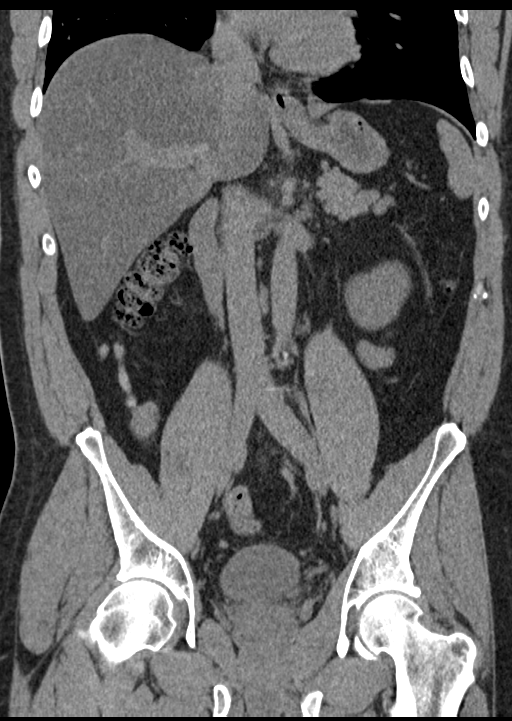

[16 of 46 positions shown; findings below may reference images not displayed]

FINDINGS: Lower chest: No acute abnormality.

Hepatobiliary: Diffuse fatty infiltration of the liver without focal
lesion. Gallbladder and bile ducts are unremarkable.

Pancreas: Unremarkable. No pancreatic ductal dilatation or
surrounding inflammatory changes.

Spleen: Normal in size without focal abnormality.

Adrenals/Urinary Tract: Both adrenals are normal. No suspicious
renal parenchymal lesions. No hydronephrosis. Multiple 2-3 mm
calculi in both collecting systems. No ureteral calculi.
Unremarkable urinary bladder

Stomach/Bowel: Small hiatal hernia. Stomach is within normal limits.
Appendix is normal. No evidence of bowel wall thickening,
distention, or inflammatory changes.

Vascular/Lymphatic: The abdominal aorta is normal in caliber with
mild atherosclerotic calcification. No adenopathy in the abdomen or
pelvis.

Reproductive: Unremarkable

Other: No focal inflammation. No ascites. Small fat containing
umbilical hernia.

Musculoskeletal: No significant skeletal lesion.
IMPRESSION: 1. Bilateral nephrolithiasis.  No ureteral calculi.
2. Hepatic steatosis.
3. Small fat containing umbilical hernia.
4. Aortic atherosclerosis.

## 2019-06-22 ENCOUNTER — Other Ambulatory Visit: Payer: Self-pay

## 2019-06-22 ENCOUNTER — Ambulatory Visit (INDEPENDENT_AMBULATORY_CARE_PROVIDER_SITE_OTHER): Payer: 59 | Admitting: Urology

## 2019-06-22 DIAGNOSIS — Z3009 Encounter for other general counseling and advice on contraception: Secondary | ICD-10-CM

## 2019-06-22 NOTE — Patient Instructions (Signed)

## 2019-06-23 ENCOUNTER — Encounter: Payer: Self-pay | Admitting: Urology

## 2019-06-23 NOTE — Progress Notes (Signed)
06/22/2019 8:52 AM   Matthew Stokes [birth year] [id number]  Referring provider: No referring provider defined for this encounter.  Chief Complaint  Patient presents with  . VAS Consult    HPI: Matthew Stokes is a 39 y.o. male who has 2 children.  He is not married but does not desire additional children and has requested vasectomy as a means of permanent sterilization.  He denies previous history of urologic problems or chronic scrotal pain.  He has a scrotal cyst which has been present for several years that he would also like excised at the time of vasectomy.   PMH: Past Medical History:  Diagnosis Date  . Chlamydia   . Hypertension     Surgical History: No past surgical history on file.  Home Medications:  Allergies as of 06/22/2019   No Known Allergies     Medication List       Accurate as of June 22, 2019 11:59 PM. If you have any questions, ask your nurse or doctor.        HYDROcodone-acetaminophen 5-325 MG tablet Commonly known as: NORCO/VICODIN Take 1 tablet by mouth every 4 (four) hours as needed for moderate pain.   ibuprofen 200 MG tablet Commonly known as: ADVIL Take 800 mg by mouth every 6 (six) hours as needed for moderate pain.   ondansetron 8 MG disintegrating tablet Commonly known as: Zofran ODT Take 1 tablet (8 mg total) by mouth every 8 (eight) hours as needed for nausea or vomiting.       Allergies: No Known Allergies  Family History: No family history on file.  Social History:  reports that he has been smoking. He has never used smokeless tobacco. He reports current alcohol use. He reports current drug use. Drug: Marijuana.   Physical Exam: There were no vitals taken for this visit.  Constitutional:  Alert and oriented, No acute distress. HEENT: [place] AT, moist mucus membranes.  Trachea midline, no masses. Cardiovascular: No clubbing, cyanosis, or edema. Respiratory: Normal respiratory effort, no increased work of  breathing. GU: Phallus without lesions, testes descended bilaterally without masses or tenderness.  Vasa easily palpable bilaterally. ~1 cm inclusion cyst left posterior scrotum Skin: No rashes, bruises or suspicious lesions. Neurologic: Grossly intact, no focal deficits, moving all 4 extremities. Psychiatric: Normal mood and affect.   Assessment & Plan:    - Undesired fertility He wishes to schedule vasectomy and will perform excision of the inclusion cyst at the same time.  We had a long discussion about vasectomy. We specifically discussed the procedure, recovery and the risks, benefits and alternatives of vasectomy. I explained that the procedure entails removal of a segment of each vas deferens, each of which conducts sperm, and that the purpose of this procedure is to cause sterility (inability to produce children or cause pregnancy). Vasectomy is intended to be permanent and irreversible form of contraception. Options for fertility after vasectomy include vasectomy reversal, or sperm retrieval with in vitro fertilization. These options are not always successful, and they may be expensive. We discussed reversible forms of birth control such as condoms, IUD or diaphragms, as well as the option of freezing sperm in a sperm bank prior to the vasectomy procedure. We discussed the importance of avoiding strenuous exercise for four days after vasectomy, and the importance of refraining from any form of ejaculation for seven days after vasectomy. I explained that vasectomy does not produce immediate sterility so another form of contraceptive must be used until sterility is assured  by having semen checked for sperm. Thus, a post vasectomy semen analysis is necessary to confirm sterility. Rarely, vasectomy must be repeated. We discussed the approximately 1 in 2,000 risk of pregnancy after vasectomy for men who have post-vasectomy semen analysis showing absent sperm or rare non-motile sperm. Typical side  effects include a small amount of oozing blood, some discomfort and mild swelling in the area of incision.  Vasectomy does not affect sexual performance, function, please, sensation, interest, desire, satisfaction, penile erection, volume of semen or ejaculation. Other rare risks include allergy or adverse reaction to an anesthetic, testicular atrophy, hematoma, infection/abscess, prolonged tenderness of the vas deferens, pain, swelling, painful nodule or scar (called sperm granuloma) or epididymtis. We discussed chronic testicular pain syndrome. This has been reported to occur in as many as 1-2% of men and may be permanent. This can be treated with medication, small procedures or (rarely) surgery.  He declined Valium Rx as a preprocedure anxiolytic   Abbie Sons, MD  [place] [address] [place], [place] [address] [phone]

## 2019-07-04 ENCOUNTER — Telehealth: Payer: Self-pay | Admitting: Urology

## 2019-07-04 NOTE — Telephone Encounter (Signed)
Patient wants a valium called in to the [facility] on [address] prior to his vasectomy   Thanks, Marcelino Duster

## 2019-07-05 MED ORDER — DIAZEPAM 10 MG PO TABS: ORAL_TABLET | ORAL | 0 refills | Status: DC

## 2019-08-03 NOTE — Progress Notes (Signed)
Vasectomy Procedure Note  Indications: The patient is a 39 y.o. male who presents today for elective sterilization.  He has been consented for the procedure.  He is aware of the risks and benefits.  He had no additional questions.  He agrees to proceed.  He denies any other significant change since his last visit.  Pre-operative Diagnosis:  1. Elective sterilization 2.  Left scrotal cyst  Post-operative Diagnosis: Same  Procedure: 1.  Vasectomy 2.  Excision scrotal cyst  Premedication: Valium 10 mg po  Surgeon: Lorin Picket C. Shavell Nored, M.D  Description: The patient was prepped and draped in the standard fashion.  The right vas deferens was identified and brought superiorly to the anterior scrotal skin.  The skin and vas was then anesthetized utilizing  ml 1% lidocaine.  A small stab incision was made and spread with the vas dissector.  The vas was grasped utilizing the vas clamp and elevated out of the incision.  The vas was dissected free from surrounding tissue and vessels and an ~1 cm segment was excised.  The vas lumens were cauterized utilizing electrocautery.  The distal segment was buried in the surrounding sheath with a 3-0 chromic suture.  No significant bleeding was observed.  The vas ends were then dropped back into the hemiscrotum.  The skin was closed with hemostatic pressure.  An identical procedure was performed on the contralateral side.    The left posterior scrotal cyst was then palpated and the overlying skin nests size.  A 1 cm incision was made over the cyst.  The cyst was bluntly and sharply separated from surrounding tissues and was somewhat adherent.  Hemostasis was obtained with cautery.  The cyst was completely excised and measured 1 cm.  Subcutaneous tissues were reapproximated with interrupted 3-0 chromic suture and the skin was closed with interrupted 3-0 chromic suture.  Clean, dry gauze was placed to the vasectomy incisions and scrotal cyst.  The patient tolerated  procedure well.  Complications:None  Recommendations: 1.  No lifting greater than 10 pounds or strenuousactivity for 1 week. 2.  Scrotal support for 1 week. 3.  Shower only for 1 week; may shower in the morning 4.  May resume intercourse in one week if no significant discomfort.  Continue alternate contraception for 12 weeks.  5.  Call for significant pain, swelling, redness, drainage or fever greater than 100.5. 6.  Rx hydrocodone/APAP 5/325 1-2 every 6 hours as needed for pain. 7.  Follow-up semen analysis in 12 weeks.  Solon Augusta, am acting as a scribe for Dr. Lorin Picket C. Ivon Roedel,  I have reviewed the above documentation for accuracy and completeness, and I agree with the above.   Riki Altes, MD

## 2019-08-04 ENCOUNTER — Encounter: Payer: Self-pay | Admitting: Urology

## 2019-08-04 ENCOUNTER — Other Ambulatory Visit: Payer: Self-pay

## 2019-08-04 ENCOUNTER — Ambulatory Visit (INDEPENDENT_AMBULATORY_CARE_PROVIDER_SITE_OTHER): Payer: 59 | Admitting: Urology

## 2019-08-04 VITALS — BP 154/95 | HR 98 | Ht 72.0 in | Wt 245.0 lb

## 2019-08-04 DIAGNOSIS — Z302 Encounter for sterilization: Secondary | ICD-10-CM

## 2019-08-04 DIAGNOSIS — L729 Follicular cyst of the skin and subcutaneous tissue, unspecified: Secondary | ICD-10-CM

## 2019-08-04 MED ORDER — HYDROCODONE-ACETAMINOPHEN 5-325 MG PO TABS: 1.0000 | ORAL_TABLET | ORAL | 0 refills | Status: DC | PRN

## 2019-08-07 ENCOUNTER — Encounter: Payer: Self-pay | Admitting: Urology

## 2019-09-27 ENCOUNTER — Other Ambulatory Visit: Payer: Self-pay

## 2019-09-27 ENCOUNTER — Ambulatory Visit (INDEPENDENT_AMBULATORY_CARE_PROVIDER_SITE_OTHER): Payer: 59 | Admitting: Physician Assistant

## 2019-09-27 VITALS — BP 151/97 | HR 105 | Ht 70.0 in | Wt 248.9 lb

## 2019-09-27 DIAGNOSIS — N492 Inflammatory disorders of scrotum: Secondary | ICD-10-CM | POA: Diagnosis not present

## 2019-09-27 MED ORDER — SULFAMETHOXAZOLE-TRIMETHOPRIM 800-160 MG PO TABS: 1.0000 | ORAL_TABLET | Freq: Two times a day (BID) | ORAL | 0 refills | Status: AC

## 2019-09-27 NOTE — Progress Notes (Signed)
09/27/2019 4:33 PM   Rodney Cruise 01/01/1981 [id number]  CC: Chief Complaint  Patient presents with  . Post-op Problem   HPI: Matthew Stokes is a 39 y.o. male s/p vasectomy and excision of a left posterior scrotal cyst with Dr. Lonna Cobb on 08/04/2019 who presents today for evaluation of possible surgical site infection.  Today, patient reports a 2-day history of swelling, tenderness, and warmth at the site of his prior posterior scrotal cyst.  He has attempted to manipulate this at home and notes that it has drained foul-smelling purulent output.  He denies fever, chills, nausea, and vomiting.  He denies a history of scrotal abscess.  PMH: Past Medical History:  Diagnosis Date  . Chlamydia   . Hypertension     Surgical History: No past surgical history on file.  Home Medications:  Allergies as of 09/27/2019   No Known Allergies     Medication List       Accurate as of September 27, 2019  4:33 PM. If you have any questions, ask your nurse or doctor.        diazepam 10 MG tablet Commonly known as: VALIUM 1 tab po 30 min prior to procedure   HYDROcodone-acetaminophen 5-325 MG tablet Commonly known as: NORCO/VICODIN Take 1 tablet by mouth every 4 (four) hours as needed for moderate pain.   ibuprofen 200 MG tablet Commonly known as: ADVIL Take 800 mg by mouth every 6 (six) hours as needed for moderate pain.   ondansetron 8 MG disintegrating tablet Commonly known as: Zofran ODT Take 1 tablet (8 mg total) by mouth every 8 (eight) hours as needed for nausea or vomiting.   sulfamethoxazole-trimethoprim 800-160 MG tablet Commonly known as: BACTRIM DS Take 1 tablet by mouth 2 (two) times daily for 5 days. Started by: Carman Ching, PA-C       Allergies:  No Known Allergies  Family History: No family history on file.  Social History:   reports that he has been smoking. He has never used smokeless tobacco. He reports current alcohol use. He reports  current drug use. Drug: Marijuana.  Physical Exam: BP (!) 151/97 (BP Location: Left Arm, Patient Position: Sitting, Cuff Size: Large)   Pulse (!) 105   Ht 5\' 10"  (1.778 m)   Wt 248 lb 14.4 oz (112.9 kg)   BMI 35.71 kg/m   Constitutional:  Alert and oriented, no acute distress, nontoxic appearing HEENT: [place], AT Cardiovascular: No clubbing, cyanosis, or edema Respiratory: Normal respiratory effort, no increased work of breathing GU: 4 x 4 centimeter abscess along the posterior left scrotum.  Overlying erythema and fluctuance noted, no purulent drainage at the time of exam.  Two sinuses noted on the surface of the abscess. Skin: No rashes, bruises or suspicious lesions Neurologic: Grossly intact, no focal deficits, moving all 4 extremities Psychiatric: Normal mood and affect  Assessment & Plan:   1. Scrotal abscess Abscess noted on the posterior left scrotum at the site of prior cyst excision.  I assisted Dr. in clinic today with incision and drainage, procedure note below.  Prescribing Bactrim DS twice daily x5 days for manage of cellulitic component along the superficial aspect of the abscess.  We will plan for dressing change in clinic tomorrow.  Counseled the patient to premedicate with 40 mg ibuprofen and [phone] mg acetaminophen in advance of his scheduled appointment.  He expressed understanding.  Patient was cleaned and prepped in a sterile fashion with iodine scrub.  Skin was  numbed with 1% lidocaine solution.  Using a 15 blade, a linear incision was made over the abscess measuring approximately 3cm. The abscess immediately drained foul-smelling, purulent fluid. Abscess cavity was probed with forceps to break up any loculations and the wound was packed with 1/4 inch iodoform gauze leaving an approximate 1.5cm tail protruding from the incision. Wound was covered with an ABD pad and secured in place with a Tegaderm. Patient tolerated well, no complications were noted. -  sulfamethoxazole-trimethoprim (BACTRIM DS) 800-160 MG tablet; Take 1 tablet by mouth 2 (two) times daily for 5 days.  Dispense: 10 tablet; Refill: 0   Return in about 1 day (around 09/28/2019) for Packing change.  Carman Ching, PA-C  [facility] [address], [address] [place], [place] [address] [phone]

## 2019-09-27 NOTE — Patient Instructions (Addendum)
Come back to clinic tomorrow with your fiancee for a dressing change and to learn how to do these at home. Take 400mg  Advil/ibuprofen (generally 2 tablets) and 650-1000mg  Tylenol/acetaminophen (generally 2 tablets) about 45 minutes prior to your appointment.  Incision and Drainage, Care After This sheet gives you information about how to care for yourself after your procedure. Your health care provider may also give you more specific instructions. If you have problems or questions, contact your health care provider. What can I expect after the procedure? After the procedure, it is common to have:  Pain or discomfort around the incision site.  Blood, fluid, or pus (drainage) from the incision.  Redness and firm skin around the incision site. Follow these instructions at home: Medicines  Take over-the-counter and prescription medicines only as told by your health care provider.  If you were prescribed an antibiotic medicine, use or take it as told by your health care provider. Do not stop using the antibiotic even if you start to feel better. Wound care Follow instructions from your health care provider about how to take care of your wound. Make sure you:  Wash your hands with soap and water before and after you change your bandage (dressing). If soap and water are not available, use hand sanitizer.  Change your dressing and packing as told by your health care provider. ? If your dressing is dry or stuck when you try to remove it, moisten or wet the dressing with saline or water so that it can be removed without harming your skin or tissues. ? If your wound is packed, leave it in place until your health care provider tells you to remove it. To remove the packing, moisten or wet the packing with saline or water so that it can be removed without harming your skin or tissues.  Leave stitches (sutures), skin glue, or adhesive strips in place. These skin closures may need to stay in place for 2  weeks or longer. If adhesive strip edges start to loosen and curl up, you may trim the loose edges. Do not remove adhesive strips completely unless your health care provider tells you to do that. Check your wound every day for signs of infection. Check for:  More redness, swelling, or pain.  More fluid or blood.  Warmth.  Pus or a bad smell. If you were sent home with a drain tube in place, follow instructions from your health care provider about:  How to empty it.  How to care for it at home.  General instructions  Rest the affected area.  Do not take baths, swim, or use a hot tub until your health care provider approves. Ask your health care provider if you may take showers. You may only be allowed to take sponge baths.  Return to your normal activities as told by your health care provider. Ask your health care provider what activities are safe for you. Your health care provider may put you on activity or lifting restrictions.  The incision will continue to drain. It is normal to have some clear or slightly bloody drainage. The amount of drainage should lessen each day.  Do not apply any creams, ointments, or liquids unless you have been told to by your health care provider.  Keep all follow-up visits as told by your health care provider. This is important. Contact a health care provider if:  Your cyst or abscess returns.  You have a fever or chills.  You have more redness, swelling,  or pain around your incision.  You have more fluid or blood coming from your incision.  Your incision feels warm to the touch.  You have pus or a bad smell coming from your incision.  You have red streaks above or below the incision site. Get help right away if:  You have severe pain or bleeding.  You cannot eat or drink without vomiting.  You have decreased urine output.  You become short of breath.  You have chest pain.  You cough up blood.  The affected area becomes numb or  starts to tingle. These symptoms may represent a serious problem that is an emergency. Do not wait to see if the symptoms will go away. Get medical help right away. Call your local emergency services (911 in the [place] Do not drive yourself to the hospital. Summary  After this procedure, it is common to have fluid, blood, or pus coming from the surgery site.  Follow all home care instructions. You will be told how to take care of your incision, how to check for infection, and how to take medicines.  If you were prescribed an antibiotic medicine, take it as told by your health care provider. Do not stop taking the antibiotic even if you start to feel better.  Contact a health care provider if you have increased redness, swelling, or pain around your incision. Get help right away if you have chest pain, you vomit, you cough up blood, or you have shortness of breath.  Keep all follow-up visits as told by your health care provider. This is important. This information is not intended to replace advice given to you by your health care provider. Make sure you discuss any questions you have with your health care provider. Document Revised: 02/01/2018 Document Reviewed: 02/01/2018 Elsevier Patient Education  2020 [employer].

## 2019-09-28 ENCOUNTER — Ambulatory Visit (INDEPENDENT_AMBULATORY_CARE_PROVIDER_SITE_OTHER): Payer: 59 | Admitting: Physician Assistant

## 2019-09-28 ENCOUNTER — Encounter: Payer: Self-pay | Admitting: Physician Assistant

## 2019-09-28 VITALS — BP 145/84 | HR 102 | Ht 70.0 in | Wt 247.0 lb

## 2019-09-28 DIAGNOSIS — N492 Inflammatory disorders of scrotum: Secondary | ICD-10-CM

## 2019-09-28 NOTE — Progress Notes (Signed)
   09/28/2019 3:39 PM   Rodney Cruise 23-Jun-1980 [id number]  CC: Chief Complaint  Patient presents with  . Wound Check    HPI: Matthew Stokes is a 39 y.o. male who presents today for wound check and packing change s/p in-office I&D of a posterior left scrotal abscess yesterday.  Today, patient reports feeling well since the procedure.  He did take ibuprofen and Tylenol in advance of his appointment today as instructed.   PMH: Past Medical History:  Diagnosis Date  . Chlamydia   . Hypertension     Surgical History: No past surgical history on file.  Home Medications:  Allergies as of 09/28/2019   No Known Allergies     Medication List       Accurate as of September 28, 2019  3:39 PM. If you have any questions, ask your nurse or doctor.        diazepam 10 MG tablet Commonly known as: VALIUM 1 tab po 30 min prior to procedure   HYDROcodone-acetaminophen 5-325 MG tablet Commonly known as: NORCO/VICODIN Take 1 tablet by mouth every 4 (four) hours as needed for moderate pain.   ibuprofen 200 MG tablet Commonly known as: ADVIL Take 800 mg by mouth every 6 (six) hours as needed for moderate pain.   ondansetron 8 MG disintegrating tablet Commonly known as: Zofran ODT Take 1 tablet (8 mg total) by mouth every 8 (eight) hours as needed for nausea or vomiting.   sulfamethoxazole-trimethoprim 800-160 MG tablet Commonly known as: BACTRIM DS Take 1 tablet by mouth 2 (two) times daily for 5 days.       Allergies:  No Known Allergies  Family History: No family history on file.  Social History:   reports that he has been smoking. He has never used smokeless tobacco. He reports current alcohol use. He reports current drug use. Drug: Marijuana.  Physical Exam: BP (!) 145/84   Pulse (!) 102   Ht 5\' 10"  (1.778 m)   Wt 247 lb (112 kg)   BMI 35.44 kg/m   Constitutional:  Alert and oriented, no acute distress, nontoxic appearing HEENT: [place], AT Cardiovascular:  No clubbing, cyanosis, or edema Respiratory: Normal respiratory effort, no increased work of breathing GU: Significant improvement in erythema overlying site of previous abscess.  No residual bleeding or purulence.  Base of the abscess visualized with healthy granulation tissue. Skin: No rashes, bruises or suspicious lesions Neurologic: Grossly intact, no focal deficits, moving all 4 extremities Psychiatric: Normal mood and affect  Assessment & Plan:   1. Scrotal abscess Recovering well after I&D yesterday, erythema significantly improved and healthy wound bed visualized today.  Explained to the patient that the goal is to promote wound healing by secondary intention to prevent abscess reaccumulation.  Changed iodoform packing in clinic today. Counseled patient to remove a length of 1-2cm every day and keep the tail trimmed, then to replace the packing when it is removed in its entirety and to keep performing this until the abscess cavity has closed. He expressed understanding. Iodoform packing and sterile Q tips provided today. Patient tolerated well.  Return in about 4 weeks (around 10/26/2019) for Wound recheck.  12/26/2019, PA-C  [facility] [address] [place], [place] [phone]

## 2019-09-28 NOTE — Patient Instructions (Signed)
I replaced your wound packing material in clinic today and left a small portion of it dangling out of the opening so you can identify it later.  Every day, I want you to pull the end of the packing about 1/2-1 inch and cut the tail shorter. The ultimate goal is to give the wound room to heal from the bottom up so that it doesn't form another pocket for infection to grow.  When you get to the end of the packing, replace it: use a sterile q-tip to push the end of the packing in the jar into the wound and fill it with the packing until it is full but not tight--it shouldn't feel tense or painful when you're done. Then repeat the process as above. Keep doing this until the wound heals from the bottom up and there is no longer a pocket under the skin to pack.  I'll see you back in clinic in 1 month. Call me if you have any problems before then and I will see you sooner.

## 2019-10-27 ENCOUNTER — Ambulatory Visit: Payer: 59 | Admitting: Physician Assistant

## 2019-10-28 ENCOUNTER — Encounter: Payer: Self-pay | Admitting: Physician Assistant

## 2019-11-08 ENCOUNTER — Other Ambulatory Visit: Payer: Self-pay

## 2019-11-08 DIAGNOSIS — Z3009 Encounter for other general counseling and advice on contraception: Secondary | ICD-10-CM

## 2019-11-09 ENCOUNTER — Other Ambulatory Visit: Payer: 59

## 2019-11-10 ENCOUNTER — Other Ambulatory Visit: Payer: 59

## 2019-11-10 ENCOUNTER — Other Ambulatory Visit: Payer: Self-pay

## 2019-11-10 DIAGNOSIS — Z3009 Encounter for other general counseling and advice on contraception: Secondary | ICD-10-CM

## 2019-11-11 LAB — POST-VAS SPERM EVALUATION,QUAL: Volume: 1.3 mL

## 2019-11-14 ENCOUNTER — Telehealth: Payer: Self-pay | Admitting: *Deleted

## 2019-11-14 NOTE — Telephone Encounter (Signed)
-----   Message from Riki Altes, MD sent at 11/13/2019  3:05 PM EDT ----- Semen analysis showed no sperm present.  Okay to use vasectomy as primary contraception

## 2019-11-14 NOTE — Telephone Encounter (Signed)
Notified patient as instructed, patient pleased. Discussed follow-up appointments, patient agrees  

## 2020-05-11 ENCOUNTER — Other Ambulatory Visit: Payer: Self-pay

## 2020-05-11 ENCOUNTER — Ambulatory Visit
Admission: EM | Admit: 2020-05-11 | Discharge: 2020-05-11 | Disposition: A | Discharge status: Home / Self Care | Payer: 59 | Attending: Family Medicine | Admitting: Family Medicine

## 2020-05-11 DIAGNOSIS — L03012 Cellulitis of left finger: Secondary | ICD-10-CM

## 2020-05-11 MED ORDER — KETOROLAC TROMETHAMINE 60 MG/2ML IM SOLN
60.0000 mg | Freq: Once | INTRAMUSCULAR | Status: AC
  Administered 2020-05-11: 60 mg via INTRAMUSCULAR

## 2020-05-11 MED ORDER — CEPHALEXIN 500 MG PO CAPS: 500.0000 mg | ORAL_CAPSULE | Freq: Two times a day (BID) | ORAL | 0 refills | Status: DC

## 2020-05-11 NOTE — ED Provider Notes (Signed)
[facility]    CSN: [id number] Arrival date & time: 05/11/20  1032      History   Chief Complaint Chief Complaint  Patient presents with  . Abscess    HPI Matthew Stokes is a 40 y.o. male.   Here today with pain and swelling of distal left middle finger progressively worsening the last 3-4 days. No known injury to area, good movement and sensation per patient. Denies fever, chills, numbness, tingling. Not trying anything OTC for sxs.      Past Medical History:  Diagnosis Date  . Chlamydia   . Hypertension     Patient Active Problem List   Diagnosis Date Noted  . HYPERTENSION, BENIGN ESSENTIAL 02/01/2009  . TOBACCO USER 12/23/2008  . ABDOMINAL PAIN 10/13/2008  . DE QUERVAIN'S TENOSYNOVITIS 07/21/2008  . SORE THROAT 05/05/2008  . PREHYPERTENSION 12/22/2007    History reviewed. No pertinent surgical history.     Home Medications    Prior to Admission medications   Medication Sig Start Date End Date Taking? Authorizing Provider  cephALEXin (KEFLEX) 500 MG capsule Take 1 capsule (500 mg total) by mouth 2 (two) times daily. 05/11/20  Yes Particia Nearing, PA-C  diazepam (VALIUM) 10 MG tablet 1 tab po 30 min prior to procedure Patient not taking: Reported on 09/27/2019 07/05/19   Riki Altes, MD  HYDROcodone-acetaminophen (NORCO/VICODIN) 5-325 MG tablet Take 1 tablet by mouth every 4 (four) hours as needed for moderate pain. Patient not taking: Reported on 09/27/2019 08/04/19   Riki Altes, MD  ibuprofen (ADVIL,MOTRIN) 200 MG tablet Take 800 mg by mouth every 6 (six) hours as needed for moderate pain. Patient not taking: Reported on 09/27/2019    [provider]  ondansetron (ZOFRAN ODT) 8 MG disintegrating tablet Take 1 tablet (8 mg total) by mouth every 8 (eight) hours as needed for nausea or vomiting. Patient not taking: Reported on 09/27/2019 09/11/16   Azalia Bilis, MD    Family History Family History  Family history  unknown: Yes    Social History Social History   Tobacco Use  . Smoking status: Current Every Day Smoker  . Smokeless tobacco: Never Used  Vaping Use  . Vaping Use: Every day  Substance Use Topics  . Alcohol use: Yes  . Drug use: Yes    Types: Marijuana     Allergies   Patient has no known allergies.   Review of Systems Review of Systems PER HPI    Physical Exam Triage Vital Signs ED Triage Vitals  Enc Vitals Group     BP 05/11/20 1046 (!) 144/92     Pulse Rate 05/11/20 1046 89     Resp 05/11/20 1046 18     Temp 05/11/20 1046 97.9 F (36.6 C)     Temp src --      SpO2 05/11/20 1046 94 %     Weight --      Height --      Head Circumference --      Peak Flow --      Pain Score 05/11/20 1045 8     Pain Loc --      Pain Edu? --      Excl. in GC? --    No data found.  Updated Vital Signs BP (!) 144/92   Pulse 89   Temp 97.9 F (36.6 C)   Resp 18   SpO2 94%   Visual Acuity Right Eye Distance:   Left Eye  Distance:   Bilateral Distance:    Right Eye Near:   Left Eye Near:    Bilateral Near:     Physical Exam Vitals and nursing note reviewed.  Constitutional:      Appearance: Normal appearance.  HENT:     Head: Atraumatic.  Eyes:     Extraocular Movements: Extraocular movements intact.     Conjunctiva/sclera: Conjunctivae normal.  Cardiovascular:     Rate and Rhythm: Normal rate and regular rhythm.     Pulses: Normal pulses.  Pulmonary:     Effort: Pulmonary effort is normal.     Breath sounds: Normal breath sounds.  Musculoskeletal:        General: Normal range of motion.     Cervical back: Normal range of motion and neck supple.  Skin:    General: Skin is warm and dry.     Comments: Paronychia present distally and along nail edge left middle finger. Significantly ttp, surrounding erythema  Neurological:     General: No focal deficit present.     Mental Status: He is oriented to person, place, and time.     Comments: Left hand  neurovascularly intact  Psychiatric:        Mood and Affect: Mood normal.        Thought Content: Thought content normal.        Judgment: Judgment normal.     UC Treatments / Results  Labs (all labs ordered are listed, but only abnormal results are displayed) Labs Reviewed - No data to display  EKG   Radiology No results found.  Procedures Incision and Drainage  Date/Time: 05/11/2020 12:35 PM Performed by: Particia Nearing, PA-C Authorized by: Particia Nearing, PA-C   Consent:    Consent obtained:  Verbal   Consent given by:  Patient   Risks, benefits, and alternatives were discussed: yes     Risks discussed:  Incomplete drainage, infection, bleeding and pain   Alternatives discussed:  Alternative treatment Universal protocol:    Procedure explained and questions answered to patient or proxy's satisfaction: yes     Relevant documents present and verified: yes     Test results available : yes     Imaging studies available: yes     Required blood products, implants, devices, and special equipment available: yes     Site/side marked: yes     Immediately prior to procedure, a time out was called: yes     Patient identity confirmed:  Verbally with patient and arm band Location:    Type:  Abscess   Location:  Upper extremity   Upper extremity location:  Finger   Finger location:  L long finger Pre-procedure details:    Skin preparation:  Chlorhexidine with alcohol Sedation:    Sedation type:  None Anesthesia:    Anesthesia method:  Local infiltration   Local anesthetic:  Lidocaine 1% w/o epi Procedure type:    Complexity:  Simple Procedure details:    Ultrasound guidance: no     Needle aspiration: no     Incision types:  Stab incision   Incision depth:  Subcutaneous   Wound management:  Probed and deloculated   Drainage:  Purulent   Drainage amount:  Moderate   Wound treatment:  Wound left open   Packing materials:  None Post-procedure details:     Procedure completion:  Tolerated well, no immediate complications   (including critical care time)  Medications Ordered in UC Medications  ketorolac (TORADOL) injection 60  mg (60 mg Intramuscular Given 05/11/20 1131)    Initial Impression / Assessment and Plan / UC Course  I have reviewed the triage vital signs and the nursing notes.  Pertinent labs & imaging results that were available during my care of the patient were reviewed by me and considered in my medical decision making (see chart for details).     IM toradol given for pain in clinic, I and D performed without complication with near complete drainage of abscess, and sent home on keflex and instructed on home wound care. Return precautions reviewed at length.   Final Clinical Impressions(s) / UC Diagnoses   Final diagnoses:  Paronychia of finger of left hand   Discharge Instructions   None    ED Prescriptions    Medication Sig Dispense Auth. Provider   cephALEXin (KEFLEX) 500 MG capsule Take 1 capsule (500 mg total) by mouth 2 (two) times daily. 14 capsule Particia Nearing, [place]     PDMP not reviewed this encounter.   Particia Nearing, [place] 05/11/20 1238

## 2020-05-11 NOTE — ED Triage Notes (Signed)
Pt presents with left 3rd finger swelling at fingernail for past couple of days

## 2022-03-12 ENCOUNTER — Telehealth: Payer: Self-pay

## 2022-03-12 NOTE — Telephone Encounter (Signed)
LVM. Sending MyChart msg. AS, CMA 

## 2024-01-07 ENCOUNTER — Encounter: Payer: Self-pay | Admitting: Emergency Medicine

## 2024-01-07 ENCOUNTER — Ambulatory Visit
Admission: EM | Admit: 2024-01-07 | Discharge: 2024-01-07 | Disposition: A | Discharge status: Home / Self Care | Attending: Emergency Medicine | Admitting: Emergency Medicine

## 2024-01-07 DIAGNOSIS — L989 Disorder of the skin and subcutaneous tissue, unspecified: Secondary | ICD-10-CM | POA: Diagnosis not present

## 2024-01-07 MED ORDER — HYDROCORTISONE 1 % EX CREA: TOPICAL_CREAM | CUTANEOUS | 0 refills | Status: AC

## 2024-01-07 NOTE — ED Triage Notes (Signed)
 Pt presents c/o abscess in parineam area x 60 days. Pt states,  I have a knot in [place] that changes sizes. It will swell a little then go back down. It is swollen now. It's not painful but irritating.

## 2024-01-07 NOTE — Discharge Instructions (Addendum)
 You can try applying the hydrocortisone cream twice daily to the area. This can help with itching and act as a skin barrier Try warm soaks in the bath  Avoid tight fitting clothing Keep area clean and dry  Please follow up with your new primary care provider regarding symptoms

## 2024-01-07 NOTE — ED Provider Notes (Signed)
 [facility]    CSN: [id number] Arrival date & time: 01/07/24  1453      History   Chief Complaint Chief Complaint  Patient presents with   Abscess    HPI Matthew Stokes is a 43 y.o. male.   Concern for sensation in the rectal area Has changed sizes, intermittent swelling over the last 2 months, comes and goes without known aggravator.  It has never been painful. It's more irritation, sometimes itching  No rectal pain, changes in bowel habits, bleeding No testicular pain or swelling Denies fever or chills  Denies any history of this Does report a cyst on testicle a while ago from prolonged sitting    Past Medical History:  Diagnosis Date   Chlamydia    Hypertension     Patient Active Problem List   Diagnosis Date Noted   HYPERTENSION, BENIGN ESSENTIAL 02/01/2009   TOBACCO USER 12/23/2008   ABDOMINAL PAIN 10/13/2008   DE QUERVAIN'S TENOSYNOVITIS 07/21/2008   SORE THROAT 05/05/2008   PREHYPERTENSION 12/22/2007    History reviewed. No pertinent surgical history.     Home Medications    Prior to Admission medications   Medication Sig Start Date End Date Taking? Authorizing Provider  hydrocortisone cream 1 % Apply to affected area 2 times daily 01/07/24  Yes Bernadett Milian, [place], PA-C    Family History Family History  Family history unknown: Yes    Social History Social History   Tobacco Use   Smoking status: Every Day    Types: Cigarettes    Passive exposure: Current   Smokeless tobacco: Never  Vaping Use   Vaping status: Every Day  Substance Use Topics   Alcohol use: Yes   Drug use: Yes    Types: Marijuana     Allergies   Patient has no known allergies.   Review of Systems Review of Systems  As per HPI  Physical Exam Triage Vital Signs ED Triage Vitals  Encounter Vitals Group     BP 01/07/24 1704 (!) 159/92     Girls Systolic BP Percentile --      Girls Diastolic BP Percentile --      Boys Systolic BP  Percentile --      Boys Diastolic BP Percentile --      Pulse Rate 01/07/24 1704 78     Resp 01/07/24 1704 18     Temp 01/07/24 1704 97.9 F (36.6 C)     Temp Source 01/07/24 1704 Oral     SpO2 01/07/24 1704 97 %     Weight 01/07/24 1703 200 lb (90.7 kg)     Height --      Head Circumference --      Peak Flow --      Pain Score 01/07/24 1703 0     Pain Loc --      Pain Education --      Exclude from Growth Chart --    No data found.  Updated Vital Signs BP (!) 159/92 (BP Location: Right Arm)   Pulse 78   Temp 97.9 F (36.6 C) (Oral)   Resp 18   Wt 200 lb (90.7 kg)   SpO2 97%   BMI 28.70 kg/m    Physical Exam Vitals and nursing note reviewed. Exam conducted with a chaperone present Engineer, site).  Constitutional:      General: He is not in acute distress.    Appearance: Normal appearance.  HENT:     Mouth/Throat:  Pharynx: Oropharynx is clear.  Cardiovascular:     Rate and Rhythm: Normal rate and regular rhythm.     Heart sounds: Normal heart sounds.  Pulmonary:     Effort: Pulmonary effort is normal.     Breath sounds: Normal breath sounds.  Abdominal:     Palpations: Abdomen is soft.     Tenderness: There is no abdominal tenderness.  Genitourinary:     Comments: There are some skin changes in the perineum, almost appears to be scar tissue. No tenderness, fluctuance, induration, erythema, mass. No hemorrhoid or fissure at rectum. No rash of swelling of scrotum.  Neurological:     Mental Status: He is alert and oriented to person, place, and time.     UC Treatments / Results  Labs (all labs ordered are listed, but only abnormal results are displayed) Labs Reviewed - No data to display  EKG  Radiology No results found.  Procedures Procedures   Medications Ordered in UC Medications - No data to display  Initial Impression / Assessment and Plan / UC Course  I have reviewed the triage vital signs and the nursing notes.  Pertinent labs & imaging  results that were available during my care of the patient were reviewed by me and considered in my medical decision making (see chart for details).  Possible scar tissue of perineal area, although no prior injury known per patient. There is no sign of infection or mass.  Sometimes area is itching, but never painful Can try hydrocortisone BID for comfort and itch, warm soaks, avoid tight fitting clothing, keep area clean, monitor symptoms.   Staff has set patient up with a PCP visit for 12/9 (just over a month) Will follow up regarding symptoms Return and ED Precautions in the meantime  Patient agrees to plan, no questions at this time   Final Clinical Impressions(s) / UC Diagnoses   Final diagnoses:  Lesion of male perineum     Discharge Instructions      You can try applying the hydrocortisone cream twice daily to the area. This can help with itching and act as a skin barrier Try warm soaks in the bath  Avoid tight fitting clothing Keep area clean and dry  Please follow up with your new primary care provider regarding symptoms      ED Prescriptions     Medication Sig Dispense Auth. Provider   hydrocortisone cream 1 % Apply to affected area 2 times daily 15 g Masato Pettie, [place], PA-C      PDMP not reviewed this encounter.   Cohen Boettner, PA-C 01/07/24 2057

## 2024-02-17 ENCOUNTER — Ambulatory Visit: Admitting: General Practice

## 2024-02-17 DIAGNOSIS — Z7689 Persons encountering health services in other specified circumstances: Secondary | ICD-10-CM

## 2024-02-22 ENCOUNTER — Telehealth: Payer: Self-pay | Admitting: Family

## 2024-02-22 NOTE — Telephone Encounter (Signed)
 Called patient to reschedule appt for 12/9.  Due to the weather the office will not open until 10am on 12/9. Will also send patient a medical laboratory scientific officer.

## 2024-02-23 ENCOUNTER — Ambulatory Visit: Admitting: Family

## 2024-03-29 ENCOUNTER — Ambulatory Visit: Admitting: Family

## 2024-03-29 ENCOUNTER — Encounter: Payer: Self-pay | Admitting: Family

## 2024-03-29 VITALS — BP 149/97 | HR 82 | Ht 70.0 in | Wt 214.0 lb

## 2024-03-29 DIAGNOSIS — R229 Localized swelling, mass and lump, unspecified: Secondary | ICD-10-CM

## 2024-03-29 DIAGNOSIS — Z7689 Persons encountering health services in other specified circumstances: Secondary | ICD-10-CM

## 2024-03-29 DIAGNOSIS — R030 Elevated blood-pressure reading, without diagnosis of hypertension: Secondary | ICD-10-CM

## 2024-03-29 DIAGNOSIS — L989 Disorder of the skin and subcutaneous tissue, unspecified: Secondary | ICD-10-CM

## 2024-03-29 NOTE — Progress Notes (Signed)
 "  Subjective:    Matthew Stokes - 44 y.o. male MRN [id number]  Date of birth: 03-09-1981  HPI  Matthew Stokes is to establish care.    Current issues and/or concerns: - Patient seen on 01/07/2024 (1 hours) at [facility] at [facility]) for lesion of male perineum. Today patient reports since then has resolved and denies red flag symptoms.  - Skin nodule behind left ear for 4 months. Denies red flag symptoms. Would like referral to specialist.  - Elevated blood pressure today in office. He does not check blood pressure at home. He limits salt intake. He does not exercise outside of his normal routine. He does not complain of red flag symptoms such as but not limited to chest pain, shortness of breath, worst headache of life, nausea/vomiting.  - Patient denies anxiety depression concerns on screening. He denies thoughts of self-harm, suicidal ideations, homicidal ideations.  ROS per HPI     Health Maintenance:  Health Maintenance Due  Topic Date Due   HIV Screening  Never done   Hepatitis C Screening  Never done   Hepatitis B Vaccines 19-59 Average Risk (1 of 3 - 19+ 3-dose series) Never done   HPV VACCINES (1 - Risk 3-dose SCDM series) Never done   DTaP/Tdap/Td (7 - Tdap) 01/23/2018   Influenza Vaccine  Never done     Past Medical History: Patient Active Problem List   Diagnosis Date Noted   HYPERTENSION, BENIGN ESSENTIAL 02/01/2009   TOBACCO USER 12/23/2008      Social History   reports that he quit smoking about 3 months ago. His smoking use included cigarettes. He has been exposed to tobacco smoke. He has never used smokeless tobacco. He reports that he does not currently use alcohol. He reports that he does not currently use drugs after having used the following drugs: Marijuana.   Family History  family history includes Hypertension in his mother.   Medications: reviewed and updated   Objective:   Physical Exam BP (!) 149/97    Pulse 82   Ht 5' 10 (1.778 m)   Wt 214 lb (97.1 kg)   SpO2 97%   BMI 30.71 kg/m   Physical Exam HENT:     Head: Normocephalic and atraumatic.     Nose: Nose normal.     Mouth/Throat:     Mouth: Mucous membranes are moist.     Pharynx: Oropharynx is clear.  Eyes:     Extraocular Movements: Extraocular movements intact.     Conjunctiva/sclera: Conjunctivae normal.     Pupils: Pupils are equal, round, and reactive to light.  Cardiovascular:     Rate and Rhythm: Normal rate and regular rhythm.     Pulses: Normal pulses.     Heart sounds: Normal heart sounds.  Pulmonary:     Effort: Pulmonary effort is normal.     Breath sounds: Normal breath sounds.  Genitourinary:    Comments: Patient declined. Musculoskeletal:        General: Normal range of motion.     Cervical back: Normal range of motion and neck supple.  Skin:    General: Skin is warm and dry.     Comments: Firm skin nodule behind right ear, no drainage.  Neurological:     General: No focal deficit present.     Mental Status: He is alert and oriented to person, place, and time.  Psychiatric:        Mood and Affect:  Mood normal.        Behavior: Behavior normal.     Assessment & Plan:  1. Encounter to establish care (Primary) - Patient presents today to establish care. During the interim follow-up with primary provider as scheduled.  - Return for annual physical examination, labs, and health maintenance. Arrive fasting meaning having no food for at least 8 hours prior to appointment. You may have only water or black coffee. Please take scheduled medications as normal.  2. Lesion of male perineum - Resolved.   3. Skin nodule - Referral to Dermatology for evaluation/management.  - Ambulatory referral to Dermatology  4. Elevated blood pressure reading - Blood pressure not at goal during today's visit. Patient asymptomatic without chest pressure, chest pain, palpitations, shortness of breath, worst headache of  life, and any additional red flag symptoms. - Follow-up with primary provider in 4 weeks or sooner if needed.   Patient was given clear instructions to go to Emergency Department or return to medical center if symptoms don't improve, worsen, or new problems develop.The patient verbalized understanding.  I discussed the assessment and treatment plan with the patient. The patient was provided an opportunity to ask questions and all were answered. The patient agreed with the plan and demonstrated an understanding of the instructions.   The patient was advised to call back or seek an in-person evaluation if the symptoms worsen or if the condition fails to improve as anticipated.    Greig Chute, NP 03/29/2024, 8:44 AM Primary Care at [facility]  "

## 2024-11-01 ENCOUNTER — Encounter: Payer: Self-pay | Admitting: Family
